# Patient Record
Sex: Female | Born: 1955 | Race: White | Hispanic: No | Marital: Married | Smoking: Never smoker
Health system: Southern US, Community
[De-identification: ages and names within clinical notes are randomized; demographics above are authoritative.]

## PROBLEM LIST (undated history)

## (undated) DIAGNOSIS — I10 Essential (primary) hypertension: Secondary | ICD-10-CM

## (undated) DIAGNOSIS — F32A Depression, unspecified: Secondary | ICD-10-CM

## (undated) DIAGNOSIS — D219 Benign neoplasm of connective and other soft tissue, unspecified: Secondary | ICD-10-CM

## (undated) DIAGNOSIS — F329 Major depressive disorder, single episode, unspecified: Secondary | ICD-10-CM

## (undated) HISTORY — DX: Essential (primary) hypertension: I10

## (undated) HISTORY — DX: Depression, unspecified: F32.A

## (undated) HISTORY — PX: BREAST BIOPSY: SHX20

## (undated) HISTORY — DX: Major depressive disorder, single episode, unspecified: F32.9

## (undated) HISTORY — DX: Benign neoplasm of connective and other soft tissue, unspecified: D21.9

---

## 1994-01-13 HISTORY — PX: BREAST EXCISIONAL BIOPSY: SUR124

## 1999-02-12 ENCOUNTER — Encounter: Payer: Self-pay | Admitting: Internal Medicine

## 1999-02-12 ENCOUNTER — Ambulatory Visit (HOSPITAL_COMMUNITY): Admission: RE | Admit: 1999-02-12 | Discharge: 1999-02-12 | Payer: Self-pay | Admitting: Internal Medicine

## 2000-01-14 HISTORY — PX: MYOMECTOMY: SHX85

## 2000-03-26 ENCOUNTER — Encounter: Payer: Self-pay | Admitting: Internal Medicine

## 2000-03-26 ENCOUNTER — Ambulatory Visit (HOSPITAL_COMMUNITY): Admission: RE | Admit: 2000-03-26 | Discharge: 2000-03-26 | Payer: Self-pay | Admitting: Internal Medicine

## 2000-12-28 ENCOUNTER — Ambulatory Visit (HOSPITAL_COMMUNITY): Admission: RE | Admit: 2000-12-28 | Discharge: 2000-12-28 | Payer: Self-pay | Admitting: Internal Medicine

## 2000-12-28 ENCOUNTER — Encounter: Payer: Self-pay | Admitting: Internal Medicine

## 2001-02-24 ENCOUNTER — Encounter: Admission: RE | Admit: 2001-02-24 | Discharge: 2001-02-24 | Payer: Self-pay | Admitting: Obstetrics and Gynecology

## 2001-02-24 ENCOUNTER — Encounter: Payer: Self-pay | Admitting: Obstetrics and Gynecology

## 2001-05-05 ENCOUNTER — Encounter (INDEPENDENT_AMBULATORY_CARE_PROVIDER_SITE_OTHER): Payer: Self-pay

## 2001-05-05 ENCOUNTER — Ambulatory Visit (HOSPITAL_COMMUNITY): Admission: RE | Admit: 2001-05-05 | Discharge: 2001-05-05 | Payer: Self-pay | Admitting: Obstetrics and Gynecology

## 2002-09-15 ENCOUNTER — Encounter: Payer: Self-pay | Admitting: *Deleted

## 2002-09-15 ENCOUNTER — Encounter: Admission: RE | Admit: 2002-09-15 | Discharge: 2002-09-15 | Payer: Self-pay | Admitting: *Deleted

## 2002-09-20 ENCOUNTER — Other Ambulatory Visit: Admission: RE | Admit: 2002-09-20 | Discharge: 2002-09-20 | Payer: Self-pay | Admitting: Internal Medicine

## 2002-10-26 ENCOUNTER — Ambulatory Visit (HOSPITAL_COMMUNITY): Admission: RE | Admit: 2002-10-26 | Discharge: 2002-10-26 | Payer: Self-pay | Admitting: Internal Medicine

## 2002-10-26 ENCOUNTER — Encounter: Payer: Self-pay | Admitting: Internal Medicine

## 2004-01-12 ENCOUNTER — Ambulatory Visit (HOSPITAL_COMMUNITY): Admission: RE | Admit: 2004-01-12 | Discharge: 2004-01-12 | Payer: Self-pay | Admitting: Obstetrics and Gynecology

## 2005-07-21 ENCOUNTER — Other Ambulatory Visit: Admission: RE | Admit: 2005-07-21 | Discharge: 2005-07-21 | Payer: Self-pay | Admitting: Obstetrics and Gynecology

## 2005-08-05 ENCOUNTER — Ambulatory Visit (HOSPITAL_COMMUNITY): Admission: RE | Admit: 2005-08-05 | Discharge: 2005-08-05 | Payer: Self-pay | Admitting: Obstetrics and Gynecology

## 2006-08-20 ENCOUNTER — Ambulatory Visit (HOSPITAL_COMMUNITY): Admission: RE | Admit: 2006-08-20 | Discharge: 2006-08-20 | Payer: Self-pay | Admitting: Obstetrics and Gynecology

## 2007-11-09 ENCOUNTER — Other Ambulatory Visit: Admission: RE | Admit: 2007-11-09 | Discharge: 2007-11-09 | Payer: Self-pay | Admitting: Obstetrics and Gynecology

## 2007-12-23 ENCOUNTER — Ambulatory Visit (HOSPITAL_COMMUNITY): Admission: RE | Admit: 2007-12-23 | Discharge: 2007-12-23 | Payer: Self-pay | Admitting: Obstetrics and Gynecology

## 2008-12-19 ENCOUNTER — Other Ambulatory Visit: Admission: RE | Admit: 2008-12-19 | Discharge: 2008-12-19 | Payer: Self-pay | Admitting: Obstetrics and Gynecology

## 2009-01-08 ENCOUNTER — Ambulatory Visit (HOSPITAL_COMMUNITY): Admission: RE | Admit: 2009-01-08 | Discharge: 2009-01-08 | Payer: Self-pay | Admitting: Obstetrics and Gynecology

## 2009-05-29 ENCOUNTER — Encounter: Admission: RE | Admit: 2009-05-29 | Discharge: 2009-05-29 | Payer: Self-pay | Admitting: Otolaryngology

## 2010-05-31 NOTE — H&P (Signed)
[facility]  Patient:    Monica Dennis, Monica Dennis Visit Number: [id number] MRN: [id number]          Service Type: DSU Location: [facility] Attending Physician:  Madelyn Flavors Dictated by:   Beather Arbour Thomasena Edis, M.D. Admit Date:  05/05/2001 Discharge Date: 05/05/2001   CC:         Darius Bump, M.D.   History and Physical  DATE OF BIRTH:                October 12, 1955  CHIEF COMPLAINT:              The patient is a 55 year old gravida 3 para 28 Caucasian female, referred via the courtesy of Dr. Madison Hickman.  The patient presented to Dr. Daphine Deutscher complaining of menorrhagia characterized by periods every three to five weeks, with the first three days extremely heavy, requiring her to change her pads on an almost hourly basis.  HISTORY OF PRESENT ILLNESS:   She also never makes it through the night without having to get up to change her clothes.  She subsequently had a pelvic ultrasound which described a thickened endometrial canal.  She subsequently underwent a sonohysterogram which showed a 7.6 x 2.2 mm polyp within the inferior aspect of the endometrial cavity.  The patient was advised to undergo D&C, hysteroscopy, and polypectomy to remove this polyp.  The risks of surgery including anesthetic complications, hemorrhage, infection, damage to adjacent structures including bladder, bowel, blood vessels, or ureter were discussed with the patient.  She was made aware of the risk of uterine perforation which could result in overwhelming life-threatening hemorrhage requiring emergent hysterectomy, and uterine perforation which could result in bowel damage requiring emergent colostomy, which could result in overwhelming peritonitis. She expresses understanding of and acceptance of these risks and desires to proceed with the surgery.  PAST MEDICAL HISTORY:         1. Hypertension.                               2. History of migraines.  MEDICATIONS:                   1. Hydrochlorothiazide 12.5 mg q.d.                               2. Multivitamin.                               3. Calcium.                               4. Magnesium.                               5. Zinc.                               6. Glucosamine.  PAST SURGICAL HISTORY:        1. Wisdom teeth extraction.                               2. Spontaneous  vaginal delivery x3.  SOCIAL HISTORY:               The patient is Environmental health practitioner at [employer].  She runs 20-25 miles per week and lifts weights.  She denies cigarettes and alcohol.  ALLERGIES:                    No known drug allergies.  FAMILY HISTORY:               There is no family history of colon, breast, ovarian, or prostate cancer.  The patients mother is 86, with hypertension, hypercholesterolemia, and glaucoma.  Her father is 79, with type 2 diabetes, hypertension, coronary artery disease, and a triple A bypass x3.  She has a 70 year old sister with hypertension.  Five brothers, range in age from 27-52; four of the five with hypertension, three of the five with hypercholesterolemia.  Three children, alive and well.  REVIEW OF SYSTEMS:            Noncontributory except as noted above.  Denies headache, visual changes, chest pain, shortness of breath, abdominal pain, change in bowel habits, unintentional weight loss, dysuria, urgency, frequency, vaginal pruritus or discharge, pain or bleeding with intercourse.  PHYSICAL EXAMINATION:  GENERAL APPEARANCE:           Well-developed Caucasian female.  VITAL SIGNS:                  Blood pressure 134/80, heart rate 60.  Weight 138 pounds.  HEENT:                        Normal.  NECK:                         Supple without thyromegaly, adenopathy, or nodules.  CHEST:                        Clear to auscultation.  CARDIAC:                      Regular rate and rhythm without murmurs, rubs, or gallops.  ABDOMEN:                      Soft,  nontender.  No hepatosplenomegaly or masses.  EXTREMITIES:                  No clubbing, cyanosis, or edema.  NEUROLOGIC:                   Oriented x3, grossly normal.  PELVIC:                       Normal external female genitalia.  No vulva, vaginal, or cervical lesions.  Pap smear performed by Dr. Daphine Deutscher recently was within normal limits.  Bimanual examination reveals the uterus to be normal, mobile, and nontender, without any adnexal mass palpated.  RECTAL:                       Not performed.  ASSESSMENT/PLAN:              The patient is a 55 year old Caucasian female with menorrhagia, gravida 3 para 3, with an endometrial polyp found on sonohysterogram, admitted for dilatation and curettage, hysteroscopy, and polypectomy.  This has been discussed with the  patient and she expresses understanding of and acceptance of these risks, and desires to proceed with her surgery. Dictated by:   Beather Arbour Thomasena Edis, M.D. Attending Physician:  Madelyn Flavors DD:  05/05/01 TD:  05/05/01 Job: [id number]

## 2010-05-31 NOTE — Op Note (Signed)
[facility]  Patient:    Monica Dennis, Monica Dennis Visit Number: [id number] MRN: [id number]          Service Type: DSU Location: [facility] Attending Physician:  Madelyn Flavors Dictated by:   Beather Arbour Thomasena Edis, M.D. Proc. Date: 05/05/01 Admit Date:  05/05/2001 Discharge Date: 05/05/2001   CC:         Darius Bump, M.D.   Operative Report  PREOPERATIVE DIAGNOSIS:       Menorrhagia, endometrial polyps on sonohysterogram.  POSTOPERATIVE DIAGNOSIS:      Menorrhagia, endometrial polyps on sonohysterogram.  Copious polypoid endometrium.  OPERATION:                    Dilatation and curettage, hysteroscopy.  SURGEON:                      Beather Arbour. Thomasena Edis, M.D.  ASSISTANT:  ANESTHESIA:                   General by LMA.  FLUIDS:                       1200 cc of crystalloid.  DRAINS:                       None.  ESTIMATED BLOOD LOSS:         Minimal.  COMPLICATIONS:                None.  DESCRIPTION OF PROCEDURE:     The patient was brought to the operating room and identified on the operating table.  After induction of adequate general anesthesia by LMA, the patient was placed in the dorsal lithotomy position and prepped and draped in the usual sterile fashion.  The bladder was straight catheterized for appz 50 cc of clear yellow urine.  The bimanual examination revealed the uterus to be anteverted, six weeks, mobile, and nontender without any adnexal mass palpated.  The anterior lip of the cervix was infiltrated with 1 cc of 1% lidocaine and grasped with a single tooth tenaculum.  The cervix was very carefully and gently dilated up to a #27 Pratt dilator. Dilatation proceeded very smoothly and carefully.  The [facility] hysteroscope was placed and using Sorbitol as a distending medium, the careful hysteroscopic examination was performed.  I was not able to see the tubal ostia due to the copious polypoid nature of the endometrium.  This was so polypoid,  that I was able to see the polyp identified on sonohysterogram, but there were copious other polyps as well.  Thus it was difficult at times to have excellent focusing on the endometrium, however, this was very copious and polypoid in appearance.  After a hysteroscopic examination, the scope was withdrawn and the curettage was performed in a systematic clockwise fashion with copious tissue obtained.  Polyps were removed in addition.  Randall stone forceps were placed and additional polyps were removed.  The endometrium was curetted until a good cry was heard all around.  At that point, the procedure was then terminated.  10 cc of 1% lidocaine were infused for a paracervical block for postoperative comfort.  There was noted to be minimal bleeding from any of the new paracervical block sites.  At that point, the procedure was terminated. The patient tolerated the procedure without apparent complications and was transferred to the recovery room in stable condition after all sponge,  needle, and instrument counts were correct.  She received a postoperative instruction sheet, urged to take ibuprofen 600 mg every six hours as needed for pain, and to call us with any problems.  She was to refrain from anything in her vagina for two weeks.  She is to return in two weeks for postoperative evaluation and call with any problems. Dictated by:   Beather Arbour Thomasena Edis, M.D. Attending Physician:  Madelyn Flavors DD:  05/05/01 TD:  05/05/01 Job: [id number]

## 2011-02-05 ENCOUNTER — Other Ambulatory Visit: Payer: Self-pay | Admitting: Certified Nurse Midwife

## 2011-02-05 DIAGNOSIS — Z1231 Encounter for screening mammogram for malignant neoplasm of breast: Secondary | ICD-10-CM

## 2011-03-05 ENCOUNTER — Ambulatory Visit (HOSPITAL_COMMUNITY)
Admission: RE | Admit: 2011-03-05 | Discharge: 2011-03-05 | Disposition: A | Discharge status: Home / Self Care | Payer: 59 | Source: Ambulatory Visit | Attending: Certified Nurse Midwife | Admitting: Certified Nurse Midwife

## 2011-03-05 DIAGNOSIS — Z1231 Encounter for screening mammogram for malignant neoplasm of breast: Secondary | ICD-10-CM | POA: Insufficient documentation

## 2012-05-18 ENCOUNTER — Other Ambulatory Visit: Payer: Self-pay | Admitting: Certified Nurse Midwife

## 2012-05-18 DIAGNOSIS — Z1231 Encounter for screening mammogram for malignant neoplasm of breast: Secondary | ICD-10-CM

## 2012-06-02 ENCOUNTER — Ambulatory Visit (HOSPITAL_COMMUNITY)
Admission: RE | Admit: 2012-06-02 | Discharge: 2012-06-02 | Disposition: A | Discharge status: Home / Self Care | Payer: 59 | Source: Ambulatory Visit | Attending: Certified Nurse Midwife | Admitting: Certified Nurse Midwife

## 2012-06-02 DIAGNOSIS — Z1231 Encounter for screening mammogram for malignant neoplasm of breast: Secondary | ICD-10-CM | POA: Insufficient documentation

## 2013-02-10 ENCOUNTER — Ambulatory Visit: Payer: Self-pay | Admitting: Certified Nurse Midwife

## 2013-02-10 ENCOUNTER — Encounter: Payer: Self-pay | Admitting: Certified Nurse Midwife

## 2013-02-16 ENCOUNTER — Ambulatory Visit (INDEPENDENT_AMBULATORY_CARE_PROVIDER_SITE_OTHER): Payer: 59 | Admitting: Certified Nurse Midwife

## 2013-02-16 ENCOUNTER — Encounter: Payer: Self-pay | Admitting: Certified Nurse Midwife

## 2013-02-16 VITALS — BP 122/80 | HR 64 | Resp 16 | Ht 62.0 in | Wt 145.0 lb

## 2013-02-16 DIAGNOSIS — Z0000 Encounter for general adult medical examination without abnormal findings: Secondary | ICD-10-CM

## 2013-02-16 DIAGNOSIS — Z01419 Encounter for gynecological examination (general) (routine) without abnormal findings: Secondary | ICD-10-CM

## 2013-02-16 DIAGNOSIS — N952 Postmenopausal atrophic vaginitis: Secondary | ICD-10-CM

## 2013-02-16 LAB — HEMOGLOBIN, FINGERSTICK: Hemoglobin, fingerstick: 14.1 g/dL (ref 12.0–16.0)

## 2013-02-16 LAB — POCT URINALYSIS DIPSTICK
BILIRUBIN UA: NEGATIVE
Blood, UA: NEGATIVE
GLUCOSE UA: NEGATIVE
KETONES UA: NEGATIVE
Leukocytes, UA: NEGATIVE
NITRITE UA: NEGATIVE
Protein, UA: NEGATIVE
Urobilinogen, UA: NEGATIVE
pH, UA: 5

## 2013-02-16 MED ORDER — ESTROGENS, CONJUGATED 0.625 MG/GM VA CREA: TOPICAL_CREAM | VAGINAL | Status: DC

## 2013-02-16 NOTE — Progress Notes (Signed)
58 y.o. G24P3003 Married Caucasian Fe here for annual exam. Menopausal no HRT. Denies vaginal bleeding. Vaginal dryness better with Premarin cream. Hypertension stable on medication. Sees PCP for labs, aex and medication management. No health issues today.  Patient's last menstrual period was 10/13/2005.          Sexually active: yes  The current method of family planning is vasectomy.    Exercising: yes  run, weights,eliptical Smoker:  no  Health Maintenance: Pap:  02-05-11 neg HPV HR neg MMG:  06-02-12 neg Colonoscopy:  2005 BMD:   2006 TDaP:  2010 Labs: Poct urine-neg, Hgb-14.1 Self breast exam: done monthly   reports that she has never smoked. She does not have any smokeless tobacco history on file. She reports that she does not drink alcohol or use illicit drugs.  Past Medical History  Diagnosis Date  . Hypertension   . Depression   . Fibroid     Past Surgical History  Procedure Laterality Date  . Myomectomy  2002    Current Outpatient Prescriptions  Medication Sig Dispense Refill  . CALCIUM PO Take by mouth daily.      Marland Kitchen conjugated estrogens (PREMARIN) vaginal cream Place 1 Applicatorful vaginally. 1/2 gram once weekly      . Glucosamine HCl (GLUCOSAMINE PO) Take by mouth daily.      Marland Kitchen losartan-hydrochlorothiazide (HYZAAR) 50-12.5 MG per tablet daily.      . Multiple Vitamins-Minerals (MULTIVITAMIN PO) Take by mouth daily.      Marland Kitchen rOPINIRole (REQUIP) 2 MG tablet Take 2 mg by mouth at bedtime.       No current facility-administered medications for this visit.    Family History  Problem Relation Age of Onset  . Diabetes Mother   . Cancer Mother     thyroid  . Hypertension Mother   . Alzheimer's disease Mother   . Diabetes Father   . Hypertension Father   . Dementia Father   . Cancer Father     skin  . Thyroid disease Sister   . Hypertension Sister   . Hypertension Brother   . Hypertension Maternal Grandmother   . Hypertension Maternal Grandfather   . Stroke  Maternal Grandfather   . Hypertension Brother   . Hypertension Brother   . Hypertension Brother   . Cancer Brother     ?  Marland Kitchen Heart disease Brother     heart valve replacement    ROS:  Pertinent items are noted in HPI.  Otherwise, a comprehensive ROS was negative.  Exam:   BP 122/80  Pulse 64  Resp 16  Ht 5\' 2"  (1.575 m)  Wt 145 lb (65.772 kg)  BMI 26.51 kg/m2  LMP 10/13/2005 Height: 5\' 2"  (157.5 cm)  Ht Readings from Last 3 Encounters:  02/16/13 5\' 2"  (1.575 m)    General appearance: alert, cooperative and appears stated age Head: Normocephalic, without obvious abnormality, atraumatic Neck: no adenopathy, supple, symmetrical, trachea midline and thyroid normal to inspection and palpation Lungs: clear to auscultation bilaterally Breasts: normal appearance, no masses or tenderness, No nipple retraction or dimpling, No nipple discharge or bleeding, No axillary or supraclavicular adenopathy Heart: regular rate and rhythm Abdomen: soft, non-tender; no masses,  no organomegaly Extremities: extremities normal, atraumatic, no cyanosis or edema Skin: Skin color, texture, turgor normal. No rashes or lesions Lymph nodes: Cervical, supraclavicular, and axillary nodes normal. No abnormal inguinal nodes palpated Neurologic: Grossly normal   Pelvic: External genitalia:  no lesions  Urethra:  normal appearing urethra with no masses, tenderness or lesions              Bartholin's and Skene's: normal                 Vagina: normal appearing vagina with normal color and discharge, no lesions              Cervix: normal, non tender              Pap taken: yes Bimanual Exam:  Uterus:  normal size, contour, position, consistency, mobility, non-tender and anteverted              Adnexa: normal adnexa and no mass, fullness, tenderness               Rectovaginal: Confirms               Anus:  normal sphincter tone, no lesions  A:  Well Woman with normal exam  Menopausal no  HRT  Colonoscopy due Atrophic Vaginitis Premarin cream working well  Hypertension stable on medication with PCP management  P:   Reviewed health and wellness pertinent to exam  Aware of need to advise if vaginal bleeding  Patient to schedule through PCP  Rx Premarin cream see order  Continue follow up as indicated  Pap smear as per guidelines   Mammogram yearly pap smear taken today with reflex  counseled on breast self exam, mammography screening, adequate intake of calcium and vitamin D, diet and exercise  return annually or prn  An After Visit Summary was printed and given to the patient.

## 2013-02-16 NOTE — Patient Instructions (Signed)

## 2013-02-18 NOTE — Progress Notes (Signed)
Reviewed personally.  M. Suzanne Khaden Gater, MD.  

## 2013-02-21 LAB — IPS PAP TEST WITH REFLEX TO HPV

## 2013-11-02 ENCOUNTER — Other Ambulatory Visit: Payer: Self-pay | Admitting: Certified Nurse Midwife

## 2013-11-02 DIAGNOSIS — Z1231 Encounter for screening mammogram for malignant neoplasm of breast: Secondary | ICD-10-CM

## 2013-11-14 ENCOUNTER — Encounter: Payer: Self-pay | Admitting: Certified Nurse Midwife

## 2013-11-22 ENCOUNTER — Ambulatory Visit (HOSPITAL_COMMUNITY)
Admission: RE | Admit: 2013-11-22 | Discharge: 2013-11-22 | Disposition: A | Discharge status: Home / Self Care | Payer: 59 | Source: Ambulatory Visit | Attending: Certified Nurse Midwife | Admitting: Certified Nurse Midwife

## 2013-11-22 DIAGNOSIS — Z1231 Encounter for screening mammogram for malignant neoplasm of breast: Secondary | ICD-10-CM

## 2013-11-23 ENCOUNTER — Ambulatory Visit (HOSPITAL_COMMUNITY): Payer: 59

## 2013-11-30 ENCOUNTER — Ambulatory Visit (HOSPITAL_COMMUNITY): Payer: 59

## 2014-02-20 ENCOUNTER — Ambulatory Visit (INDEPENDENT_AMBULATORY_CARE_PROVIDER_SITE_OTHER): Payer: 59 | Admitting: Certified Nurse Midwife

## 2014-02-20 ENCOUNTER — Encounter: Payer: Self-pay | Admitting: Certified Nurse Midwife

## 2014-02-20 VITALS — BP 120/80 | HR 72 | Resp 18 | Ht 62.0 in | Wt 150.0 lb

## 2014-02-20 DIAGNOSIS — Z124 Encounter for screening for malignant neoplasm of cervix: Secondary | ICD-10-CM

## 2014-02-20 DIAGNOSIS — Z01419 Encounter for gynecological examination (general) (routine) without abnormal findings: Secondary | ICD-10-CM

## 2014-02-20 NOTE — Progress Notes (Signed)
59 y.o. Married Caucasian female  [phone] here for annual exam. Menopausal no HRT. Denies vaginal bleeding. Premarin Cream working well for vaginal dryness. Sees PCP for hypertension management, aex, labs. Has new grandchild on the way! No health issues today.  Patient's last menstrual period was 10/13/2005.          Sexually active: Yes.    The current method of family planning is vasectomy.    Exercising: Yes.    running, spin class Last mammogram:11/22/13 negative density B , birads negative Last WFU:XNATF neg. HPVHR Last BMD: 2006 negative Alcohol:no Tobacco:no TDAP: 2013   Health Maintenance  Topic Date Due  . COLONOSCOPY  07/15/2003  . ZOSTAVAX  07/14/2013  . INFLUENZA VACCINE  08/13/2013  . MAMMOGRAM  11/23/2015  . PAP SMEAR  02/17/2016  . TETANUS/TDAP  01/13/2018    Family History  Problem Relation Age of Onset  . Diabetes Mother   . Cancer Mother     thyroid  . Hypertension Mother   . Alzheimer's disease Mother   . Diabetes Father   . Hypertension Father   . Dementia Father   . Cancer Father     skin  . Thyroid disease Sister   . Hypertension Sister   . Hypertension Brother   . Lupus Brother   . Hypertension Maternal Grandmother   . Hypertension Maternal Grandfather   . Stroke Maternal Grandfather   . Hypertension Brother   . Hypertension Brother   . Hypertension Brother   . Cancer Brother     ?  Marland Kitchen Heart disease Brother     heart valve replacement    There are no active problems to display for this patient.   Past Medical History  Diagnosis Date  . Hypertension   . Depression   . Fibroid     Past Surgical History  Procedure Laterality Date  . Myomectomy  2002    Allergies: Review of patient's allergies indicates no known allergies.  Current Outpatient Prescriptions  Medication Sig Dispense Refill  . CALCIUM PO Take by mouth daily.    Marland Kitchen conjugated estrogens (PREMARIN) vaginal cream 1/2 gram once weekly 42.5 g 4  . Glucosamine HCl  (GLUCOSAMINE PO) Take by mouth daily.    Marland Kitchen losartan-hydrochlorothiazide (HYZAAR) 50-12.5 MG per tablet daily.    . Multiple Vitamins-Minerals (MULTIVITAMIN PO) Take by mouth daily.    Marland Kitchen rOPINIRole (REQUIP) 2 MG tablet Take 2 mg by mouth at bedtime.     No current facility-administered medications for this visit.    ROS: Pertinent items are noted in HPI.  Exam:    BP 120/80 mmHg  Pulse 72  Resp 18  Ht 5\' 2"  (1.575 m)  Wt 150 lb (68.04 kg)  BMI 27.43 kg/m2  LMP 10/13/2005 Weight change: @WEIGHTCHANGE @ Last 3 height recordings:  Ht Readings from Last 3 Encounters:  02/20/14 5\' 2"  (1.575 m)  02/16/13 5\' 2"  (1.575 m)   General appearance: alert, cooperative and appears stated age Head: Normocephalic, without obvious abnormality Neck: thyroid not enlarged, symmetric, no tenderness/mass/nodules Lungs: clear to auscultation bilaterally Breasts: normal appearance, no masses or tenderness, No nipple retraction or dimpling, No nipple discharge or bleeding, No axillary or supraclavicular adenopathy Heart: regular rate and rhythm no murmur heard Abdomen: soft, non-tender; bowel sounds normal; no masses,  no organomegaly Extremities: extremities normal, atraumatic, no cyanosis or edema Skin: Skin color, texture, turgor normal. No rashes or lesions Lymph nodes: Cervical, supraclavicular, and axillary nodes normal. no inguinal nodes palpated Neurologic: Grossly  normal   Pelvic: External genitalia:  no lesions and atrophic appearance              Urethra: not indicated and normal appearing urethra with no masses, tenderness or lesions              Bartholins and Skenes: Bartholin's, Urethra, Skene's normal                 Vagina: normal appearing vagina with normal color and discharge, no lesions, moisture noted              Cervix: normal appearance and non tender, no lesions              Pap taken: No.        Bimanual Exam:  Uterus:  uterus is normal size, shape, consistency and  nontender                                      Adnexa:    normal adnexa in size, nontender and no masses and no masses                                      Rectovaginal: Confirms                                      Anus:  normal sphincter tone, no lesions  A: Well woman exam Menopausal no HRT Atrophic Vaginitis with Premarin History of ASCUS Pap with negative HPVHR Hypertension on stable medication with PCP     P:Reviewed pertinent information regarding exam Aware if vaginal bleeding needs to advise Rx Premarin Cream see order If negative repeat one year. If not per results. Continue follow up as indicated.  counseled on breast self exam, mammography screening, menopause, adequate intake of calcium and vitamin D, diet and exercise, BMD due. return annually or prn      An After Visit Summary was printed and given to the patient.

## 2014-02-20 NOTE — Progress Notes (Signed)
Reviewed personally.  M. Suzanne Amritha Yorke, MD.  

## 2014-02-20 NOTE — Patient Instructions (Addendum)

## 2014-02-24 LAB — IPS PAP TEST WITH HPV

## 2015-02-26 ENCOUNTER — Ambulatory Visit: Payer: 59 | Admitting: Certified Nurse Midwife

## 2015-02-28 ENCOUNTER — Encounter: Payer: Self-pay | Admitting: Certified Nurse Midwife

## 2015-02-28 ENCOUNTER — Telehealth: Payer: Self-pay | Admitting: Obstetrics and Gynecology

## 2015-02-28 ENCOUNTER — Ambulatory Visit (INDEPENDENT_AMBULATORY_CARE_PROVIDER_SITE_OTHER): Payer: 59 | Admitting: Certified Nurse Midwife

## 2015-02-28 ENCOUNTER — Other Ambulatory Visit: Payer: Self-pay | Admitting: Certified Nurse Midwife

## 2015-02-28 VITALS — BP 120/74 | HR 70 | Resp 16 | Ht 62.25 in | Wt 144.0 lb

## 2015-02-28 DIAGNOSIS — Z01419 Encounter for gynecological examination (general) (routine) without abnormal findings: Secondary | ICD-10-CM

## 2015-02-28 DIAGNOSIS — Z0000 Encounter for general adult medical examination without abnormal findings: Secondary | ICD-10-CM | POA: Diagnosis not present

## 2015-02-28 DIAGNOSIS — N952 Postmenopausal atrophic vaginitis: Secondary | ICD-10-CM | POA: Diagnosis not present

## 2015-02-28 DIAGNOSIS — Z780 Asymptomatic menopausal state: Secondary | ICD-10-CM

## 2015-02-28 DIAGNOSIS — Z1231 Encounter for screening mammogram for malignant neoplasm of breast: Secondary | ICD-10-CM

## 2015-02-28 LAB — POCT URINALYSIS DIPSTICK
BILIRUBIN UA: NEGATIVE
GLUCOSE UA: NEGATIVE
KETONES UA: NEGATIVE
Leukocytes, UA: NEGATIVE
Nitrite, UA: NEGATIVE
Protein, UA: NEGATIVE
RBC UA: NEGATIVE
Urobilinogen, UA: NEGATIVE
pH, UA: 5

## 2015-02-28 MED ORDER — ESTROGENS, CONJUGATED 0.625 MG/GM VA CREA: TOPICAL_CREAM | VAGINAL | Status: DC

## 2015-02-28 NOTE — Telephone Encounter (Signed)
Patient needs a order for mammogram/bone density sent to [facility]. Best # to reach: [phone]

## 2015-02-28 NOTE — Telephone Encounter (Signed)
Order placed for bilateral screening mammogram and [facility] via [facility]. Patient notified. She will contact [facility] at this time to schedule.  Routing to provider for final review. Patient agreeable to disposition. Will close encounter.

## 2015-02-28 NOTE — Progress Notes (Signed)
Encounter reviewed Monica Jamese Trauger, MD   

## 2015-02-28 NOTE — Progress Notes (Signed)
60 y.o. G81P3003 Married  Caucasian Fe here for annual exam.  Menopausal no HRT. Denies vaginal bleeding. Using Premarin vaginal cream for atrophic vaginitis, working well. Sees PCP for hypertension/ and restless leg syndrome medication,labs, aex. No other health issues today.  Patient's last menstrual period was 10/13/2005.          Sexually active: Yes.    The current method of family planning is vasectomy.    Exercising: Yes.    running,walking,weights, cycle Smoker:  no  Health Maintenance: Pap:  02-20-14 neg HPV HR neg MMG:  11-22-13 category b density,birads 1:neg Colonoscopy:  12/15 normal f/u 56yrs BMD:  2010 neg per patient TDaP: 2014 Shingles: no Pneumonia: no Hep C and HIV: not done Labs: poct urine-neg Self breast exam: done monthly   reports that she has never smoked. She has never used smokeless tobacco. She reports that she does not drink alcohol or use illicit drugs.  Past Medical History  Diagnosis Date  . Hypertension   . Depression   . Fibroid     Past Surgical History  Procedure Laterality Date  . Myomectomy  2002    Current Outpatient Prescriptions  Medication Sig Dispense Refill  . CALCIUM PO Take by mouth daily.    Marland Kitchen conjugated estrogens (PREMARIN) vaginal cream 1/2 gram once weekly 42.5 g 4  . Glucosamine HCl (GLUCOSAMINE PO) Take by mouth daily.    Marland Kitchen losartan-hydrochlorothiazide (HYZAAR) 50-12.5 MG per tablet daily.    . Multiple Vitamins-Minerals (MULTIVITAMIN PO) Take by mouth daily.    Marland Kitchen rOPINIRole (REQUIP) 2 MG tablet Take 2 mg by mouth at bedtime.     No current facility-administered medications for this visit.    Family History  Problem Relation Age of Onset  . Diabetes Mother   . Cancer Mother     thyroid  . Hypertension Mother   . Alzheimer's disease Mother   . Diabetes Father   . Hypertension Father   . Dementia Father   . Cancer Father     skin  . Thyroid disease Sister   . Hypertension Sister   . Hypertension Brother   .  Lupus Brother   . Hypertension Maternal Grandmother   . Hypertension Maternal Grandfather   . Stroke Maternal Grandfather   . Hypertension Brother   . Hypertension Brother   . Hypertension Brother   . Cancer Brother     ?  Marland Kitchen Heart disease Brother     heart valve replacement    ROS:  Pertinent items are noted in HPI.  Otherwise, a comprehensive ROS was negative.  Exam:   BP 120/74 mmHg  Pulse 70  Resp 16  Ht 5' 2.25" (1.581 m)  Wt 144 lb (65.318 kg)  BMI 26.13 kg/m2  LMP 10/13/2005 Height: 5' 2.25" (158.1 cm) Ht Readings from Last 3 Encounters:  02/28/15 5' 2.25" (1.581 m)  02/20/14 5\' 2"  (1.575 m)  02/16/13 5\' 2"  (1.575 m)    General appearance: alert, cooperative and appears stated age Head: Normocephalic, without obvious abnormality, atraumatic Neck: no adenopathy, supple, symmetrical, trachea midline and thyroid normal to inspection and palpation Lungs: clear to auscultation bilaterally Breasts: normal appearance, no masses or tenderness, No nipple retraction or dimpling, No nipple discharge or bleeding, No axillary or supraclavicular adenopathy Heart: regular rate and rhythm Abdomen: soft, non-tender; no masses,  no organomegaly Extremities: extremities normal, atraumatic, no cyanosis or edema Skin: Skin color, texture, turgor normal. No rashes or lesions Lymph nodes: Cervical, supraclavicular, and axillary nodes  normal. No abnormal inguinal nodes palpated Neurologic: Grossly normal   Pelvic: External genitalia:  no lesions              Urethra:  normal appearing urethra with no masses, tenderness or lesions              Bartholin's and Skene's: normal                 Vagina: normal appearing vagina with normal color and discharge, no lesions              Cervix: no cervical motion tenderness, no lesions and nulliparous appearance              Pap taken: No. Bimanual Exam:  Uterus:  normal size, contour, position, consistency, mobility, non-tender               Adnexa: normal adnexa and no mass, fullness, tenderness               Rectovaginal: Confirms               Anus:  normal sphincter tone, no lesions  Chaperone present: yes  A:  Well Woman with normal exam  Menopausal no HRT  Mammogram and BMD due  Atrophic vaginitis with Premarin use working well  Hypertension/restless leg management with PCP    P:   Reviewed health and wellness pertinent to exam  Aware of need to evaluate if vaginal bleeding  Patient aware and plans to schedule both  Desires continuance of Premarin cream  Rx Premarin Cream see order  Pap smear as above not taken   counseled on breast self exam, mammography screening, adequate intake of calcium and vitamin D, diet and exercise return annually or prn  An After Visit Summary was printed and given to the patient.

## 2015-02-28 NOTE — Patient Instructions (Signed)

## 2015-03-20 ENCOUNTER — Other Ambulatory Visit: Payer: Self-pay

## 2015-03-20 ENCOUNTER — Ambulatory Visit: Payer: Self-pay

## 2015-03-27 ENCOUNTER — Ambulatory Visit
Admission: RE | Admit: 2015-03-27 | Discharge: 2015-03-27 | Disposition: A | Discharge status: Home / Self Care | Payer: 59 | Source: Ambulatory Visit | Attending: Certified Nurse Midwife | Admitting: Certified Nurse Midwife

## 2015-03-27 DIAGNOSIS — Z1231 Encounter for screening mammogram for malignant neoplasm of breast: Secondary | ICD-10-CM

## 2015-03-27 DIAGNOSIS — Z780 Asymptomatic menopausal state: Secondary | ICD-10-CM

## 2016-02-29 ENCOUNTER — Ambulatory Visit: Payer: 59 | Admitting: Certified Nurse Midwife

## 2016-04-04 ENCOUNTER — Ambulatory Visit (INDEPENDENT_AMBULATORY_CARE_PROVIDER_SITE_OTHER): Payer: 59 | Admitting: Certified Nurse Midwife

## 2016-04-04 ENCOUNTER — Encounter: Payer: Self-pay | Admitting: Certified Nurse Midwife

## 2016-04-04 VITALS — BP 118/70 | HR 64 | Resp 16 | Ht 61.75 in | Wt 141.0 lb

## 2016-04-04 DIAGNOSIS — N952 Postmenopausal atrophic vaginitis: Secondary | ICD-10-CM | POA: Diagnosis not present

## 2016-04-04 DIAGNOSIS — Z0000 Encounter for general adult medical examination without abnormal findings: Secondary | ICD-10-CM

## 2016-04-04 DIAGNOSIS — Z124 Encounter for screening for malignant neoplasm of cervix: Secondary | ICD-10-CM

## 2016-04-04 DIAGNOSIS — Z01419 Encounter for gynecological examination (general) (routine) without abnormal findings: Secondary | ICD-10-CM | POA: Diagnosis not present

## 2016-04-04 NOTE — Progress Notes (Signed)
61 y.o. G40P3003 Married  Caucasian Fe here for annual exam. Menopausal no HRT. Denies vagina bleeding or vaginal dryness.  Sees PCP for Hypertension/restless legs medication management//aex labs. Premarin cream for atrophic vaginitis working well. Best friend Monica Dennis coming in soon to visit! Screening labs if needed. No other health issues today.  Patient's last menstrual period was 10/13/2005.          Sexually active: Yes.    The current method of family planning is vasectomy.    Exercising: Yes.    run,bike,weights Smoker:  no  Health Maintenance: Pap:  02-20-14 neg HPV HR neg MMG:  03-27-15 category d density birads 1:neg Colonoscopy: 12/15 neg f/u 56yrs BMD:   2017 TDaP:  2014 Shingles: no Pneumonia: no Hep C and HIV: not done  Labs: pcp Self breast exam:done occ   reports that she has never smoked. She has never used smokeless tobacco. She reports that she does not drink alcohol or use drugs.  Past Medical History:  Diagnosis Date  . Depression   . Fibroid   . Hypertension     Past Surgical History:  Procedure Laterality Date  . MYOMECTOMY  2002    Current Outpatient Prescriptions  Medication Sig Dispense Refill  . CALCIUM PO Take by mouth daily.    Marland Kitchen conjugated estrogens (PREMARIN) vaginal cream 1/2 gram once weekly 42.5 g 4  . losartan-hydrochlorothiazide (HYZAAR) 50-12.5 MG per tablet daily.    . Multiple Vitamins-Minerals (MULTIVITAMIN PO) Take by mouth daily.    Marland Kitchen rOPINIRole (REQUIP) 2 MG tablet Take 2 mg by mouth at bedtime.     No current facility-administered medications for this visit.     Family History  Problem Relation Age of Onset  . Diabetes Mother   . Cancer Mother     thyroid  . Hypertension Mother   . Alzheimer's disease Mother   . Diabetes Father   . Hypertension Father   . Dementia Father   . Cancer Father     skin  . Thyroid disease Sister   . Hypertension Sister   . Hypertension Brother   . Lupus Brother   . Hypertension Maternal  Grandmother   . Hypertension Maternal Grandfather   . Stroke Maternal Grandfather   . Hypertension Brother   . Hypertension Brother   . Hypertension Brother   . Cancer Brother     ?  Marland Kitchen Heart disease Brother     heart valve replacement    ROS:  Pertinent items are noted in HPI.  Otherwise, a comprehensive ROS was negative.  Exam:   BP 118/70   Pulse 64   Resp 16   Ht 5' 1.75" (1.568 m)   Wt 141 lb (64 kg)   LMP 10/13/2005   BMI 26.00 kg/m  Height: 5' 1.75" (156.8 cm) Ht Readings from Last 3 Encounters:  04/04/16 5' 1.75" (1.568 m)  02/28/15 5' 2.25" (1.581 m)  02/20/14 5\' 2"  (1.575 m)    General appearance: alert, cooperative and appears stated age Head: Normocephalic, without obvious abnormality, atraumatic Neck: no adenopathy, supple, symmetrical, trachea midline and thyroid normal to inspection and palpation Lungs: clear to auscultation bilaterally Breasts: normal appearance, no masses or tenderness, No nipple retraction or dimpling, No nipple discharge or bleeding, No axillary or supraclavicular adenopathy Heart: regular rate and rhythm Abdomen: soft, non-tender; no masses,  no organomegaly Extremities: extremities normal, atraumatic, no cyanosis or edema Skin: Skin color, texture, turgor normal. No rashes or lesions Lymph nodes: Cervical, supraclavicular, and  axillary nodes normal. No abnormal inguinal nodes palpated Neurologic: Grossly normal   Pelvic: External genitalia:  no lesions              Urethra:  normal appearing urethra with no masses, tenderness or lesions              Bartholin's and Skene's: normal                 Vagina:atrophic appearing vagina with normal color and discharge, no lesions, Premarin cream residue noted              Cervix: multiparous appearance, no bleeding following Pap, no cervical motion tenderness and no lesions              Pap taken: Yes.   Bimanual Exam:  Uterus:  normal size, contour, position, consistency, mobility,  non-tender              Adnexa: normal adnexa and no mass, fullness, tenderness               Rectovaginal: Confirms               Anus:  normal sphincter tone, no lesions  Chaperone present: yes  A:  Well Woman with normal exam  Menopausal no HRT  Atrophic vaginitis desires Premarin cream continuance, working well  Hypertension/restless legs syndrome with PCP management  Screening labs  P:   Reviewed health and wellness pertinent to exam  Aware of need to advise if vaginal bleeding  Rx Premarin Cream see orders, Risks and benefits reviewed and warning signs.  Continue MD follow up as indicated  Lab: Vitamin D  Pap smear as above with HPV reflex   counseled on breast self exam, mammography screening, adequate intake of calcium and vitamin D, diet and exercise  return annually or prn  An After Visit Summary was printed and given to the patient.

## 2016-04-04 NOTE — Patient Instructions (Signed)

## 2016-04-05 LAB — VITAMIN D 25 HYDROXY (VIT D DEFICIENCY, FRACTURES): Vit D, 25-Hydroxy: 44 ng/mL (ref 30–100)

## 2016-04-06 NOTE — Progress Notes (Signed)
Encounter reviewed Haeli Kiylee Thoreson, MD   

## 2016-04-10 LAB — IPS PAP TEST WITH REFLEX TO HPV

## 2016-04-30 ENCOUNTER — Other Ambulatory Visit: Payer: Self-pay | Admitting: Certified Nurse Midwife

## 2016-04-30 DIAGNOSIS — Z1231 Encounter for screening mammogram for malignant neoplasm of breast: Secondary | ICD-10-CM

## 2016-05-21 ENCOUNTER — Ambulatory Visit
Admission: RE | Admit: 2016-05-21 | Discharge: 2016-05-21 | Disposition: A | Discharge status: Home / Self Care | Payer: 59 | Source: Ambulatory Visit | Attending: Certified Nurse Midwife | Admitting: Certified Nurse Midwife

## 2016-05-21 DIAGNOSIS — Z1231 Encounter for screening mammogram for malignant neoplasm of breast: Secondary | ICD-10-CM

## 2016-11-17 ENCOUNTER — Other Ambulatory Visit: Payer: Self-pay | Admitting: Internal Medicine

## 2016-11-17 DIAGNOSIS — R51 Headache: Principal | ICD-10-CM

## 2016-11-17 DIAGNOSIS — R519 Headache, unspecified: Secondary | ICD-10-CM

## 2016-11-28 ENCOUNTER — Ambulatory Visit
Admission: RE | Admit: 2016-11-28 | Discharge: 2016-11-28 | Disposition: A | Discharge status: Home / Self Care | Payer: 59 | Source: Ambulatory Visit | Attending: Internal Medicine | Admitting: Internal Medicine

## 2016-11-28 DIAGNOSIS — R51 Headache: Principal | ICD-10-CM

## 2016-11-28 DIAGNOSIS — R519 Headache, unspecified: Secondary | ICD-10-CM

## 2016-11-28 MED ORDER — GADOBENATE DIMEGLUMINE 529 MG/ML IV SOLN: 13.0000 mL | Freq: Once | INTRAVENOUS | Status: DC | PRN

## 2017-04-08 ENCOUNTER — Encounter: Payer: Self-pay | Admitting: Certified Nurse Midwife

## 2017-04-09 ENCOUNTER — Telehealth: Payer: Self-pay | Admitting: Certified Nurse Midwife

## 2017-04-09 NOTE — Telephone Encounter (Signed)
Message   ----- Message from [place], Generic sent at 04/08/2017 3:31 PM EDT -----    I am taking Losartan for High BP. I saw a recall on it that said it contained carcinogens but have not heard anything more. Can you give me some direction?

## 2017-04-09 NOTE — Telephone Encounter (Signed)
Information printed regarding recalls. To Melvia Heaps CNM for review and advise.

## 2017-04-09 NOTE — Telephone Encounter (Signed)
Left message to call Jamieson Hetland at [phone]. 

## 2017-04-10 NOTE — Telephone Encounter (Signed)
Left message to call Kaitlyn at [phone]. 

## 2017-04-10 NOTE — Telephone Encounter (Signed)
Patient returning Kaitlyn's call. °

## 2017-04-15 ENCOUNTER — Ambulatory Visit (INDEPENDENT_AMBULATORY_CARE_PROVIDER_SITE_OTHER): Payer: 59 | Admitting: Certified Nurse Midwife

## 2017-04-15 ENCOUNTER — Other Ambulatory Visit: Payer: Self-pay

## 2017-04-15 ENCOUNTER — Other Ambulatory Visit (HOSPITAL_COMMUNITY)
Admission: RE | Admit: 2017-04-15 | Discharge: 2017-04-15 | Disposition: A | Discharge status: Home / Self Care | Payer: 59 | Source: Ambulatory Visit | Attending: Certified Nurse Midwife | Admitting: Certified Nurse Midwife

## 2017-04-15 ENCOUNTER — Encounter: Payer: Self-pay | Admitting: Certified Nurse Midwife

## 2017-04-15 VITALS — BP 110/78 | HR 68 | Resp 16 | Ht 62.25 in | Wt 147.0 lb

## 2017-04-15 DIAGNOSIS — N952 Postmenopausal atrophic vaginitis: Secondary | ICD-10-CM | POA: Diagnosis not present

## 2017-04-15 DIAGNOSIS — Z124 Encounter for screening for malignant neoplasm of cervix: Secondary | ICD-10-CM | POA: Diagnosis not present

## 2017-04-15 DIAGNOSIS — Z01419 Encounter for gynecological examination (general) (routine) without abnormal findings: Secondary | ICD-10-CM

## 2017-04-15 DIAGNOSIS — Z0000 Encounter for general adult medical examination without abnormal findings: Secondary | ICD-10-CM | POA: Diagnosis not present

## 2017-04-15 LAB — POCT URINALYSIS DIPSTICK
BILIRUBIN UA: NEGATIVE
Blood, UA: NEGATIVE
Glucose, UA: NEGATIVE
KETONES UA: NEGATIVE
Leukocytes, UA: NEGATIVE
NITRITE UA: NEGATIVE
PROTEIN UA: NEGATIVE
Urobilinogen, UA: NEGATIVE E.U./dL — AB
pH, UA: 5 (ref 5.0–8.0)

## 2017-04-15 MED ORDER — ESTROGENS, CONJUGATED 0.625 MG/GM VA CREA: TOPICAL_CREAM | VAGINAL | 4 refills | Status: AC

## 2017-04-15 NOTE — Patient Instructions (Signed)

## 2017-04-15 NOTE — Progress Notes (Signed)
62 y.o. G48P3003 Married  Caucasian Fe here for annual exam.  Menopausal no vaginal bleeding or vaginal dryness. Occasional hot flashes and night sweats. Sees Dr. Lavone Orn for aex and medication management of hypertension/restless legs and labs and aex. Continues with Premarin cream for vaginal dryness, uses 1-2 times weekly, with good results. Both parents have dementia/alzheimers and trying to anticipate  their needs now.. Denies any problems today.  Planning trip to [place] or ? Possible train trip for 66 wedding anniversary this summer! No other health concerns today.  Patient's last menstrual period was 10/13/2005.          Sexually active: Yes.    The current method of family planning is vasectomy.    Exercising: Yes.    run & [employer] Smoker:  no  Health Maintenance: Pap:  02-20-14 neg HPV HR neg, 04-04-16 ASCUS HPV HR- History of Abnormal Pap: no MMG:  05-21-16 category c density birads 1:neg Self Breast exams: yes Colonoscopy:  12/15 neg f/u 14yrs BMD:   2017 normal TDaP:  2014 Shingles: no Pneumonia: no Hep C and HIV: donated blood Labs: if needed   reports that she has never smoked. She has never used smokeless tobacco. She reports that she does not drink alcohol or use drugs.  Past Medical History:  Diagnosis Date  . Depression   . Fibroid   . Hypertension     Past Surgical History:  Procedure Laterality Date  . BREAST EXCISIONAL BIOPSY Left 1996   no visible scar  . MYOMECTOMY  2002    Current Outpatient Medications  Medication Sig Dispense Refill  . CALCIUM PO Take by mouth daily.    Marland Kitchen conjugated estrogens (PREMARIN) vaginal cream 1/2 gram once weekly 42.5 g 4  . losartan-hydrochlorothiazide (HYZAAR) 50-12.5 MG per tablet daily.    . Multiple Vitamins-Minerals (MULTIVITAMIN PO) Take by mouth daily.    Marland Kitchen rOPINIRole (REQUIP) 2 MG tablet Take 2 mg by mouth at bedtime.     No current facility-administered medications for this visit.     Family History  Problem  Relation Age of Onset  . Diabetes Mother   . Cancer Mother        thyroid  . Hypertension Mother   . Alzheimer's disease Mother   . Diabetes Father   . Hypertension Father   . Dementia Father   . Cancer Father        skin  . Thyroid disease Sister   . Hypertension Sister   . Hypertension Brother   . Lupus Brother   . Hypertension Maternal Grandmother   . Hypertension Maternal Grandfather   . Stroke Maternal Grandfather   . Hypertension Brother   . Hypertension Brother   . Hypertension Brother   . Cancer Brother        ?  Marland Kitchen Heart disease Brother        heart valve replacement    ROS:  Pertinent items are noted in HPI.  Otherwise, a comprehensive ROS was negative.  Exam:   LMP 10/13/2005    Ht Readings from Last 3 Encounters:  04/04/16 5' 1.75" (1.568 m)  02/28/15 5' 2.25" (1.581 m)  02/20/14 5\' 2"  (1.575 m)    General appearance: alert, cooperative and appears stated age Head: Normocephalic, without obvious abnormality, atraumatic Neck: no adenopathy, supple, symmetrical, trachea midline and thyroid normal to inspection and palpation Lungs: clear to auscultation bilaterally Breasts: normal appearance, no masses or tenderness, No nipple retraction or dimpling, No nipple discharge or  bleeding, No axillary or supraclavicular adenopathy Heart: regular rate and rhythm Abdomen: soft, non-tender; no masses,  no organomegaly Extremities: extremities normal, atraumatic, no cyanosis or edema Skin: Skin color, texture, turgor normal. No rashes or lesions Lymph nodes: Cervical, supraclavicular, and axillary nodes normal. No abnormal inguinal nodes palpated Neurologic: Grossly normal   Pelvic: External genitalia:  no lesions              Urethra:  normal appearing urethra with no masses, tenderness or lesions              Bartholin's and Skene's: normal                 Vagina: normal appearing vagina with normal color and discharge, no lesions              Cervix: no cervical  motion tenderness, no lesions and normal appearance              Pap taken: Yes.   Bimanual Exam:  Uterus:  normal size, contour, position, consistency, mobility, non-tender              Adnexa: normal adnexa and no mass, fullness, tenderness               Rectovaginal: Confirms               Anus:  normal sphincter tone, no lesions  Chaperone present: yes  A:  Well Woman with normal exam  Menopausal no HRT  Atrophic vaginitis with Premarin use with good results  ASCUS - HPVHR pap follow up today  Hypertension/restless leg syndrome with PCP management  Social stress with aging parents  P:   Reviewed health and wellness pertinent to exam  Aware of need to advise if vaginal bleeding  Discussed risks/benefits/warning signs of Premarin use, desires continuance. Can use small amount around urethra to help with very occasional stress incontinence  Rx Premarin see order with instructions  Continue to follow up with PCP as indicated  Encouraged to seek family support and take time for vacation  Pap smear: yes   counseled on breast self exam, mammography screening, feminine hygiene, adequate intake of calcium and vitamin D, diet and exercise  return annually or prn  An After Visit Summary was printed and given to the patient.

## 2017-04-16 NOTE — Telephone Encounter (Signed)
Left message to call Kaitlyn at [phone]. 

## 2017-04-17 LAB — CYTOLOGY - PAP
Diagnosis: NEGATIVE
HPV: NOT DETECTED

## 2017-04-22 NOTE — Telephone Encounter (Signed)
Melvia Heaps CNM, patient has not returned calls x 2. Please advise.

## 2017-04-22 NOTE — Telephone Encounter (Signed)
I think we can close encounter, she was in for Aex and was to address with her MD

## 2017-06-22 ENCOUNTER — Other Ambulatory Visit: Payer: Self-pay | Admitting: Certified Nurse Midwife

## 2017-06-22 DIAGNOSIS — Z1231 Encounter for screening mammogram for malignant neoplasm of breast: Secondary | ICD-10-CM

## 2017-07-20 ENCOUNTER — Ambulatory Visit
Admission: RE | Admit: 2017-07-20 | Discharge: 2017-07-20 | Disposition: A | Discharge status: Home / Self Care | Payer: 59 | Source: Ambulatory Visit | Attending: Certified Nurse Midwife | Admitting: Certified Nurse Midwife

## 2017-07-20 DIAGNOSIS — Z1231 Encounter for screening mammogram for malignant neoplasm of breast: Secondary | ICD-10-CM

## 2018-05-05 ENCOUNTER — Ambulatory Visit: Payer: 59 | Admitting: Certified Nurse Midwife

## 2018-12-14 ENCOUNTER — Other Ambulatory Visit: Payer: Self-pay | Admitting: Certified Nurse Midwife

## 2018-12-14 ENCOUNTER — Other Ambulatory Visit: Payer: Self-pay | Admitting: Cardiology

## 2018-12-14 DIAGNOSIS — Z1231 Encounter for screening mammogram for malignant neoplasm of breast: Secondary | ICD-10-CM

## 2019-02-10 ENCOUNTER — Ambulatory Visit
Admission: RE | Admit: 2019-02-10 | Discharge: 2019-02-10 | Disposition: A | Discharge status: Home / Self Care | Payer: 59 | Source: Ambulatory Visit | Attending: Certified Nurse Midwife | Admitting: Certified Nurse Midwife

## 2019-02-10 ENCOUNTER — Other Ambulatory Visit: Payer: Self-pay

## 2019-02-10 DIAGNOSIS — Z1231 Encounter for screening mammogram for malignant neoplasm of breast: Secondary | ICD-10-CM

## 2019-02-14 NOTE — Progress Notes (Signed)
Mammogram reviewed negative. Density C. Birads 1 negative Repeat yearly

## 2019-04-05 ENCOUNTER — Encounter: Payer: Self-pay | Admitting: Certified Nurse Midwife

## 2019-08-09 ENCOUNTER — Other Ambulatory Visit: Payer: Self-pay | Admitting: Internal Medicine

## 2019-08-09 DIAGNOSIS — E2839 Other primary ovarian failure: Secondary | ICD-10-CM

## 2019-08-18 ENCOUNTER — Other Ambulatory Visit: Payer: Self-pay

## 2019-08-18 ENCOUNTER — Ambulatory Visit
Admission: RE | Admit: 2019-08-18 | Discharge: 2019-08-18 | Disposition: A | Discharge status: Home / Self Care | Payer: 59 | Source: Ambulatory Visit | Attending: Internal Medicine | Admitting: Internal Medicine

## 2019-08-18 DIAGNOSIS — E2839 Other primary ovarian failure: Secondary | ICD-10-CM

## 2020-04-04 ENCOUNTER — Other Ambulatory Visit: Payer: Self-pay

## 2020-04-04 ENCOUNTER — Other Ambulatory Visit: Payer: Self-pay | Admitting: Internal Medicine

## 2020-04-04 ENCOUNTER — Ambulatory Visit
Admission: RE | Admit: 2020-04-04 | Discharge: 2020-04-04 | Disposition: A | Discharge status: Home / Self Care | Payer: Medicare Other | Source: Ambulatory Visit | Attending: Internal Medicine | Admitting: Internal Medicine

## 2020-04-04 DIAGNOSIS — Z1231 Encounter for screening mammogram for malignant neoplasm of breast: Secondary | ICD-10-CM

## 2021-08-08 ENCOUNTER — Other Ambulatory Visit: Payer: Self-pay | Admitting: Internal Medicine

## 2021-08-08 DIAGNOSIS — Z1231 Encounter for screening mammogram for malignant neoplasm of breast: Secondary | ICD-10-CM

## 2021-08-21 ENCOUNTER — Ambulatory Visit
Admission: RE | Admit: 2021-08-21 | Discharge: 2021-08-21 | Disposition: A | Discharge status: Home / Self Care | Payer: Medicare Other | Source: Ambulatory Visit | Attending: Internal Medicine | Admitting: Internal Medicine

## 2021-08-21 DIAGNOSIS — Z1231 Encounter for screening mammogram for malignant neoplasm of breast: Secondary | ICD-10-CM

## 2021-08-22 ENCOUNTER — Other Ambulatory Visit: Payer: Self-pay | Admitting: Internal Medicine

## 2021-08-22 DIAGNOSIS — E7800 Pure hypercholesterolemia, unspecified: Secondary | ICD-10-CM

## 2021-11-04 ENCOUNTER — Other Ambulatory Visit: Payer: Medicare Other

## 2021-12-18 ENCOUNTER — Other Ambulatory Visit: Payer: Medicare Other

## 2021-12-19 ENCOUNTER — Other Ambulatory Visit: Payer: Medicare Other

## 2021-12-20 ENCOUNTER — Other Ambulatory Visit: Payer: Medicare Other

## 2022-04-02 DIAGNOSIS — M2012 Hallux valgus (acquired), left foot: Secondary | ICD-10-CM | POA: Diagnosis not present

## 2022-04-02 DIAGNOSIS — M2011 Hallux valgus (acquired), right foot: Secondary | ICD-10-CM | POA: Diagnosis not present

## 2022-07-09 DIAGNOSIS — Z0000 Encounter for general adult medical examination without abnormal findings: Secondary | ICD-10-CM | POA: Diagnosis not present

## 2022-07-09 DIAGNOSIS — R21 Rash and other nonspecific skin eruption: Secondary | ICD-10-CM | POA: Diagnosis not present

## 2022-07-23 ENCOUNTER — Other Ambulatory Visit: Payer: Self-pay | Admitting: Internal Medicine

## 2022-07-23 DIAGNOSIS — Z1231 Encounter for screening mammogram for malignant neoplasm of breast: Secondary | ICD-10-CM

## 2022-08-25 ENCOUNTER — Ambulatory Visit
Admission: RE | Admit: 2022-08-25 | Discharge: 2022-08-25 | Disposition: A | Discharge status: Home / Self Care | Payer: Medicare HMO | Source: Ambulatory Visit | Attending: Internal Medicine | Admitting: Internal Medicine

## 2022-08-25 DIAGNOSIS — Z1231 Encounter for screening mammogram for malignant neoplasm of breast: Secondary | ICD-10-CM | POA: Diagnosis not present

## 2022-09-10 DIAGNOSIS — H40013 Open angle with borderline findings, low risk, bilateral: Secondary | ICD-10-CM | POA: Diagnosis not present

## 2022-09-15 DIAGNOSIS — Z0100 Encounter for examination of eyes and vision without abnormal findings: Secondary | ICD-10-CM | POA: Diagnosis not present

## 2022-10-22 DIAGNOSIS — M8588 Other specified disorders of bone density and structure, other site: Secondary | ICD-10-CM | POA: Diagnosis not present

## 2022-10-22 DIAGNOSIS — Z0000 Encounter for general adult medical examination without abnormal findings: Secondary | ICD-10-CM | POA: Diagnosis not present

## 2022-10-22 DIAGNOSIS — Z1331 Encounter for screening for depression: Secondary | ICD-10-CM | POA: Diagnosis not present

## 2022-10-23 ENCOUNTER — Other Ambulatory Visit: Payer: Self-pay | Admitting: Internal Medicine

## 2022-10-23 DIAGNOSIS — M8588 Other specified disorders of bone density and structure, other site: Secondary | ICD-10-CM

## 2022-12-19 ENCOUNTER — Other Ambulatory Visit (HOSPITAL_BASED_OUTPATIENT_CLINIC_OR_DEPARTMENT_OTHER): Payer: Self-pay | Admitting: Internal Medicine

## 2022-12-19 DIAGNOSIS — E7800 Pure hypercholesterolemia, unspecified: Secondary | ICD-10-CM | POA: Diagnosis not present

## 2022-12-19 DIAGNOSIS — I10 Essential (primary) hypertension: Secondary | ICD-10-CM | POA: Diagnosis not present

## 2022-12-19 DIAGNOSIS — M8588 Other specified disorders of bone density and structure, other site: Secondary | ICD-10-CM | POA: Diagnosis not present

## 2022-12-19 DIAGNOSIS — G2581 Restless legs syndrome: Secondary | ICD-10-CM | POA: Diagnosis not present

## 2022-12-19 DIAGNOSIS — K219 Gastro-esophageal reflux disease without esophagitis: Secondary | ICD-10-CM | POA: Diagnosis not present

## 2023-01-01 ENCOUNTER — Ambulatory Visit (HOSPITAL_COMMUNITY)
Admission: RE | Admit: 2023-01-01 | Discharge: 2023-01-01 | Disposition: A | Discharge status: Home / Self Care | Payer: Self-pay | Source: Ambulatory Visit | Attending: Internal Medicine | Admitting: Internal Medicine

## 2023-01-01 DIAGNOSIS — E7800 Pure hypercholesterolemia, unspecified: Secondary | ICD-10-CM | POA: Insufficient documentation

## 2023-02-16 DIAGNOSIS — I2510 Atherosclerotic heart disease of native coronary artery without angina pectoris: Secondary | ICD-10-CM | POA: Diagnosis not present

## 2023-02-16 DIAGNOSIS — I10 Essential (primary) hypertension: Secondary | ICD-10-CM | POA: Diagnosis not present

## 2023-02-16 DIAGNOSIS — E7800 Pure hypercholesterolemia, unspecified: Secondary | ICD-10-CM | POA: Diagnosis not present

## 2023-02-16 DIAGNOSIS — Z79899 Other long term (current) drug therapy: Secondary | ICD-10-CM | POA: Diagnosis not present

## 2023-02-27 DIAGNOSIS — R399 Unspecified symptoms and signs involving the genitourinary system: Secondary | ICD-10-CM | POA: Diagnosis not present

## 2023-02-27 DIAGNOSIS — N3001 Acute cystitis with hematuria: Secondary | ICD-10-CM | POA: Diagnosis not present

## 2023-03-13 DIAGNOSIS — N368 Other specified disorders of urethra: Secondary | ICD-10-CM | POA: Diagnosis not present

## 2023-03-13 DIAGNOSIS — Z87440 Personal history of urinary (tract) infections: Secondary | ICD-10-CM | POA: Diagnosis not present

## 2023-03-13 DIAGNOSIS — N898 Other specified noninflammatory disorders of vagina: Secondary | ICD-10-CM | POA: Diagnosis not present

## 2023-03-26 DIAGNOSIS — K644 Residual hemorrhoidal skin tags: Secondary | ICD-10-CM | POA: Diagnosis not present

## 2023-04-26 DIAGNOSIS — H61019 Acute perichondritis of external ear, unspecified ear: Secondary | ICD-10-CM | POA: Diagnosis not present

## 2023-04-29 DIAGNOSIS — H61019 Acute perichondritis of external ear, unspecified ear: Secondary | ICD-10-CM | POA: Diagnosis not present

## 2023-06-29 ENCOUNTER — Other Ambulatory Visit: Payer: Medicare HMO

## 2023-08-03 ENCOUNTER — Ambulatory Visit (HOSPITAL_BASED_OUTPATIENT_CLINIC_OR_DEPARTMENT_OTHER)
Admission: RE | Admit: 2023-08-03 | Discharge: 2023-08-03 | Disposition: A | Discharge status: Home / Self Care | Source: Ambulatory Visit | Attending: Internal Medicine | Admitting: Internal Medicine

## 2023-08-03 DIAGNOSIS — M8588 Other specified disorders of bone density and structure, other site: Secondary | ICD-10-CM | POA: Diagnosis not present

## 2023-08-03 DIAGNOSIS — Z780 Asymptomatic menopausal state: Secondary | ICD-10-CM | POA: Diagnosis not present

## 2023-08-17 DIAGNOSIS — D3617 Benign neoplasm of peripheral nerves and autonomic nervous system of trunk, unspecified: Secondary | ICD-10-CM | POA: Diagnosis not present

## 2023-08-17 DIAGNOSIS — D1801 Hemangioma of skin and subcutaneous tissue: Secondary | ICD-10-CM | POA: Diagnosis not present

## 2023-08-17 DIAGNOSIS — L570 Actinic keratosis: Secondary | ICD-10-CM | POA: Diagnosis not present

## 2023-08-17 DIAGNOSIS — L821 Other seborrheic keratosis: Secondary | ICD-10-CM | POA: Diagnosis not present

## 2023-08-17 DIAGNOSIS — L814 Other melanin hyperpigmentation: Secondary | ICD-10-CM | POA: Diagnosis not present

## 2023-10-28 ENCOUNTER — Other Ambulatory Visit: Payer: Self-pay | Admitting: Internal Medicine

## 2023-10-28 DIAGNOSIS — Z0000 Encounter for general adult medical examination without abnormal findings: Secondary | ICD-10-CM | POA: Diagnosis not present

## 2023-10-28 DIAGNOSIS — Z1231 Encounter for screening mammogram for malignant neoplasm of breast: Secondary | ICD-10-CM

## 2023-10-28 DIAGNOSIS — I10 Essential (primary) hypertension: Secondary | ICD-10-CM | POA: Diagnosis not present

## 2023-10-28 DIAGNOSIS — E7800 Pure hypercholesterolemia, unspecified: Secondary | ICD-10-CM | POA: Diagnosis not present

## 2023-10-28 DIAGNOSIS — I2510 Atherosclerotic heart disease of native coronary artery without angina pectoris: Secondary | ICD-10-CM | POA: Diagnosis not present

## 2023-10-28 DIAGNOSIS — K5909 Other constipation: Secondary | ICD-10-CM | POA: Diagnosis not present

## 2023-10-28 DIAGNOSIS — G2581 Restless legs syndrome: Secondary | ICD-10-CM | POA: Diagnosis not present

## 2023-10-28 DIAGNOSIS — K219 Gastro-esophageal reflux disease without esophagitis: Secondary | ICD-10-CM | POA: Diagnosis not present

## 2023-10-28 DIAGNOSIS — E559 Vitamin D deficiency, unspecified: Secondary | ICD-10-CM | POA: Diagnosis not present

## 2023-10-29 ENCOUNTER — Ambulatory Visit
Admission: RE | Admit: 2023-10-29 | Discharge: 2023-10-29 | Disposition: A | Discharge status: Home / Self Care | Source: Ambulatory Visit | Attending: Internal Medicine | Admitting: Internal Medicine

## 2023-10-29 DIAGNOSIS — Z1231 Encounter for screening mammogram for malignant neoplasm of breast: Secondary | ICD-10-CM | POA: Diagnosis not present

## 2023-11-04 DIAGNOSIS — H5203 Hypermetropia, bilateral: Secondary | ICD-10-CM | POA: Diagnosis not present

## 2023-12-09 DIAGNOSIS — I10 Essential (primary) hypertension: Secondary | ICD-10-CM | POA: Diagnosis not present

## 2023-12-09 DIAGNOSIS — E7800 Pure hypercholesterolemia, unspecified: Secondary | ICD-10-CM | POA: Diagnosis not present

## 2023-12-09 DIAGNOSIS — G2581 Restless legs syndrome: Secondary | ICD-10-CM | POA: Diagnosis not present

## 2023-12-09 DIAGNOSIS — E559 Vitamin D deficiency, unspecified: Secondary | ICD-10-CM | POA: Diagnosis not present
